# Patient Record
Sex: Female | Born: 1995 | Race: Black or African American | Hispanic: No | Marital: Single | Smoking: Current some day smoker
Health system: Southern US, Community
[De-identification: ages and names within clinical notes are randomized; demographics above are authoritative.]

## PROBLEM LIST (undated history)

## (undated) HISTORY — PX: CYST EXCISION: SHX5701

## (undated) HISTORY — PX: CHELATION OF EYE: SHX6708

---

## 2014-03-14 ENCOUNTER — Emergency Department (HOSPITAL_COMMUNITY): Payer: PRIVATE HEALTH INSURANCE

## 2014-03-14 ENCOUNTER — Encounter (HOSPITAL_COMMUNITY): Payer: Self-pay | Admitting: Emergency Medicine

## 2014-03-14 ENCOUNTER — Emergency Department (HOSPITAL_COMMUNITY)
Admission: EM | Admit: 2014-03-14 | Discharge: 2014-03-14 | Disposition: A | Discharge status: Home / Self Care | Payer: PRIVATE HEALTH INSURANCE | Attending: Emergency Medicine | Admitting: Emergency Medicine

## 2014-03-14 DIAGNOSIS — R509 Fever, unspecified: Secondary | ICD-10-CM | POA: Insufficient documentation

## 2014-03-14 DIAGNOSIS — Z3202 Encounter for pregnancy test, result negative: Secondary | ICD-10-CM | POA: Insufficient documentation

## 2014-03-14 DIAGNOSIS — R110 Nausea: Secondary | ICD-10-CM | POA: Insufficient documentation

## 2014-03-14 DIAGNOSIS — J029 Acute pharyngitis, unspecified: Secondary | ICD-10-CM | POA: Diagnosis present

## 2014-03-14 DIAGNOSIS — J039 Acute tonsillitis, unspecified: Secondary | ICD-10-CM | POA: Diagnosis not present

## 2014-03-14 DIAGNOSIS — IMO0001 Reserved for inherently not codable concepts without codable children: Secondary | ICD-10-CM | POA: Diagnosis not present

## 2014-03-14 DIAGNOSIS — J3489 Other specified disorders of nose and nasal sinuses: Secondary | ICD-10-CM | POA: Diagnosis not present

## 2014-03-14 DIAGNOSIS — R197 Diarrhea, unspecified: Secondary | ICD-10-CM | POA: Diagnosis not present

## 2014-03-14 LAB — COMPREHENSIVE METABOLIC PANEL
ALT: 14 U/L (ref 0–35)
AST: 15 U/L (ref 0–37)
Albumin: 3.6 g/dL (ref 3.5–5.2)
Alkaline Phosphatase: 81 U/L (ref 39–117)
Anion gap: 12 (ref 5–15)
BILIRUBIN TOTAL: 0.3 mg/dL (ref 0.3–1.2)
BUN: 5 mg/dL — ABNORMAL LOW (ref 6–23)
CALCIUM: 9 mg/dL (ref 8.4–10.5)
CO2: 23 meq/L (ref 19–32)
CREATININE: 0.48 mg/dL — AB (ref 0.50–1.10)
Chloride: 106 mEq/L (ref 96–112)
GFR calc Af Amer: >90 mL/min (ref >90)
GLUCOSE: 88 mg/dL (ref 70–99)
Potassium: 3.7 mEq/L (ref 3.7–5.3)
Sodium: 141 mEq/L (ref 137–147)
Total Protein: 7.3 g/dL (ref 6.0–8.3)

## 2014-03-14 LAB — URINALYSIS, ROUTINE W REFLEX MICROSCOPIC
BILIRUBIN URINE: NEGATIVE
Glucose, UA: NEGATIVE mg/dL
HGB URINE DIPSTICK: NEGATIVE
KETONES UR: NEGATIVE mg/dL
Nitrite: NEGATIVE
PH: 7 (ref 5.0–8.0)
Protein, ur: NEGATIVE mg/dL
SPECIFIC GRAVITY, URINE: 1.015 (ref 1.005–1.030)
Urobilinogen, UA: 1 mg/dL (ref 0.0–1.0)

## 2014-03-14 LAB — CBC WITH DIFFERENTIAL/PLATELET
Basophils Absolute: 0 10*3/uL (ref 0.0–0.1)
Basophils Relative: 0 % (ref 0–1)
EOS PCT: 1 % (ref 0–5)
Eosinophils Absolute: 0.1 10*3/uL (ref 0.0–0.7)
HEMATOCRIT: 37.3 % (ref 36.0–46.0)
Hemoglobin: 12.2 g/dL (ref 12.0–15.0)
LYMPHS ABS: 2 10*3/uL (ref 0.7–4.0)
Lymphocytes Relative: 21 % (ref 12–46)
MCH: 29.3 pg (ref 26.0–34.0)
MCHC: 32.7 g/dL (ref 30.0–36.0)
MCV: 89.7 fL (ref 78.0–100.0)
MONO ABS: 1 10*3/uL (ref 0.1–1.0)
Monocytes Relative: 10 % (ref 3–12)
Neutro Abs: 6.6 10*3/uL (ref 1.7–7.7)
Neutrophils Relative %: 68 % (ref 43–77)
Platelets: 270 10*3/uL (ref 150–400)
RBC: 4.16 MIL/uL (ref 3.87–5.11)
RDW: 12.7 % (ref 11.5–15.5)
WBC: 9.7 10*3/uL (ref 4.0–10.5)

## 2014-03-14 LAB — RAPID STREP SCREEN (MED CTR MEBANE ONLY): Streptococcus, Group A Screen (Direct): NEGATIVE

## 2014-03-14 LAB — URINE MICROSCOPIC-ADD ON

## 2014-03-14 LAB — LIPASE, BLOOD: LIPASE: 17 U/L (ref 11–59)

## 2014-03-14 LAB — POC URINE PREG, ED: Preg Test, Ur: NEGATIVE

## 2014-03-14 MED ORDER — CLINDAMYCIN HCL 300 MG PO CAPS: 300.0000 mg | ORAL_CAPSULE | Freq: Three times a day (TID) | ORAL | Status: DC

## 2014-03-14 MED ORDER — SODIUM CHLORIDE 0.9 % IV BOLUS (SEPSIS)
1000.0000 mL | Freq: Once | INTRAVENOUS | Status: AC
  Administered 2014-03-14: 1000 mL via INTRAVENOUS

## 2014-03-14 MED ORDER — DEXAMETHASONE SODIUM PHOSPHATE 10 MG/ML IJ SOLN
10.0000 mg | Freq: Once | INTRAMUSCULAR | Status: AC
  Administered 2014-03-14: 10 mg via INTRAMUSCULAR
  Filled 2014-03-14: qty 1

## 2014-03-14 MED ORDER — IOHEXOL 300 MG/ML  SOLN
75.0000 mL | Freq: Once | INTRAMUSCULAR | Status: AC | PRN
  Administered 2014-03-14: 75 mL via INTRAVENOUS

## 2014-03-14 NOTE — ED Provider Notes (Signed)
Medical screening examination/treatment/procedure(s) were performed by non-physician practitioner and as supervising physician I was immediately available for consultation/collaboration.   EKG Interpretation None       Marlon Suleiman, MD 03/14/14 1740 

## 2014-03-14 NOTE — Discharge Instructions (Signed)
Please call your doctor for a followup appointment within 24-48 hours. When you talk to your doctor please let them know that you were seen in the emergency department and have them acquire all of your records so that they can discuss the findings with you and formulate a treatment plan to fully care for your new and ongoing problems. Please take antibiotics as prescribed on a full stomach  Please rest and stay hydrated Please avoid sharing drinks or food Please continue to monitor symptoms closely and if symptoms are to worsen or change (fever greater than 101, chills, sweating, nausea, vomiting, chest pain, shortness of breathe, difficulty breathing, weakness, numbness, tingling, worsening or changes to pain pattern, neck pain, neck stiffness, throat closing sensation, difficulty swallowing, coughing up of blood) please report back to the Emergency Department immediately.    Pharyngitis Pharyngitis is a sore throat (pharynx). There is redness, pain, and swelling of your throat. HOME CARE   Drink enough fluids to keep your pee (urine) clear or pale yellow.  Only take medicine as told by your doctor.  You may get sick again if you do not take medicine as told. Finish your medicines, even if you start to feel better.  Do not take aspirin.  Rest.  Rinse your mouth (gargle) with salt water ( tsp of salt per 1 qt of water) every 1-2 hours. This will help the pain.  If you are not at risk for choking, you can suck on hard candy or sore throat lozenges. GET HELP IF:  You have large, tender lumps on your neck.  You have a rash.  You cough up green, yellow-brown, or bloody spit. GET HELP RIGHT AWAY IF:   You have a stiff neck.  You drool or cannot swallow liquids.  You throw up (vomit) or are not able to keep medicine or liquids down.  You have very bad pain that does not go away with medicine.  You have problems breathing (not from a stuffy nose). MAKE SURE YOU:   Understand  these instructions.  Will watch your condition.  Will get help right away if you are not doing well or get worse. Document Released: 12/21/2007 Document Revised: 04/24/2013 Document Reviewed: 03/11/2013 [facility] Patient Information 2015 [place], [place]. This information is not intended to replace advice given to you by your health care provider. Make sure you discuss any questions you have with your health care provider. Tonsillitis Tonsillitis is an infection of the throat that causes the tonsils to become red, tender, and swollen. Tonsils are collections of lymphoid tissue at the back of the throat. Each tonsil has crevices (crypts). Tonsils help fight nose and throat infections and keep infection from spreading to other parts of the body for the first 18 months of life.  CAUSES Sudden (acute) tonsillitis is usually caused by infection with streptococcal bacteria. Long-lasting (chronic) tonsillitis occurs when the crypts of the tonsils become filled with pieces of food and bacteria, which makes it easy for the tonsils to become repeatedly infected. SYMPTOMS  Symptoms of tonsillitis include:  A sore throat, with possible difficulty swallowing.  White patches on the tonsils.  Fever.  Tiredness.  New episodes of snoring during sleep, when you did not snore before.  Small, foul-smelling, yellowish-white pieces of material (tonsilloliths) that you occasionally cough up or spit out. The tonsilloliths can also cause you to have bad breath. DIAGNOSIS Tonsillitis can be diagnosed through a physical exam. Diagnosis can be confirmed with the results of lab tests, including  a throat culture. TREATMENT  The goals of tonsillitis treatment include the reduction of the severity and duration of symptoms and prevention of associated conditions. Symptoms of tonsillitis can be improved with the use of steroids to reduce the swelling. Tonsillitis caused by bacteria can be treated with antibiotic medicines.  Usually, treatment with antibiotic medicines is started before the cause of the tonsillitis is known. However, if it is determined that the cause is not bacterial, antibiotic medicines will not treat the tonsillitis. If attacks of tonsillitis are severe and frequent, your health care provider may recommend surgery to remove the tonsils (tonsillectomy). HOME CARE INSTRUCTIONS   Rest as much as possible and get plenty of sleep.  Drink plenty of fluids. While the throat is very sore, eat soft foods or liquids, such as sherbet, soups, or instant breakfast drinks.  Eat frozen ice pops.  Gargle with a warm or cold liquid to help soothe the throat. Mix 1/4 teaspoon of salt and 1/4 teaspoon of baking soda in 8 oz of water. SEEK MEDICAL CARE IF:   Large, tender lumps develop in your neck.  A rash develops.  A green, yellow-brown, or bloody substance is coughed up.  You are unable to swallow liquids or food for 24 hours.  You notice that only one of the tonsils is swollen. SEEK IMMEDIATE MEDICAL CARE IF:   You develop any new symptoms such as vomiting, severe headache, stiff neck, chest pain, or trouble breathing or swallowing.  You have severe throat pain along with drooling or voice changes.  You have severe pain, unrelieved with recommended medications.  You are unable to fully open the mouth.  You develop redness, swelling, or severe pain anywhere in the neck.  You have a fever. MAKE SURE YOU:   Understand these instructions.  Will watch your condition.  Will get help right away if you are not doing well or get worse. Document Released: 04/13/2005 Document Revised: 11/18/2013 Document Reviewed: 12/21/2012 [facility] Patient Information 2015 [place], [place]. This information is not intended to replace advice given to you by your health care provider. Make sure you discuss any questions you have with your health care provider.   Emergency Department Resource Guide 1) Find a Doctor  and Pay Out of Pocket Although you won't have to find out who is covered by your insurance plan, it is a good idea to ask around and get recommendations. You will then need to call the office and see if the doctor you have chosen will accept you as a new patient and what types of options they offer for patients who are self-pay. Some doctors offer discounts or will set up payment plans for their patients who do not have insurance, but you will need to ask so you aren't surprised when you get to your appointment.  2) Contact Your Local Health Department Not all health departments have doctors that can see patients for sick visits, but many do, so it is worth a call to see if yours does. If you don't know where your local health department is, you can check in your phone book. The CDC also has a tool to help you locate your state's health department, and many state websites also have listings of all of their local health departments.  3) Find a Walk-in Clinic If your illness is not likely to be very severe or complicated, you may want to try a walk in clinic. These are popping up all over the country in pharmacies, drugstores, and  shopping centers. They're usually staffed by nurse practitioners or physician assistants that have been trained to treat common illnesses and complaints. They're usually fairly quick and inexpensive. However, if you have serious medical issues or chronic medical problems, these are probably not your best option.  No Primary Care Doctor: - Call Health Connect at  [phone] - they can help you locate a primary care doctor that  accepts your insurance, provides certain services, etc. - Physician Referral Service- [phone]  Chronic Pain Problems: Organization         Address  Phone   Notes  [facility]  [phone] Patients need to be referred by their primary care doctor.   Medication Assistance: Organization         Address  Phone    Notes  [facility] Medication [facility] [address] [place], [place] [address] [phone] --Must be a resident of [facility] -- Must have NO insurance coverage whatsoever (no Medicaid/ Medicare, etc.) -- The pt. MUST have a primary care doctor that directs their care regularly and follows them in the community   MedAssist  [phone]   [employer]  [phone]    Agencies that provide inexpensive medical care: Organization         Address  Phone   Notes  [place]  [phone]   [facility]    [phone]   [facility] [place], [place] [address] [phone]   [place] [address] [place] [phone]   Planned Parenthood    [phone]   [facility]    [phone]   [place] and [place] [address], [place] Phone:  [phone], Fax:  [phone] Hours of Operation:  9 am - 6 pm, M-F.  Also accepts Medicaid/Medicare and self-pay.  [facility] for [place], [address], [place] Phone: [phone], Fax: [phone]. Hours of Operation:  8:30 am - 5:30 pm, M-F.  Also accepts Medicaid and self-pay.  [facility] [address], [place] Phone: [phone]   [place], [place], [place] [phone], Ext. [phone] Mondays & Thursdays: 7-9 AM.  First 15 patients are seen on a first come, first serve basis.    [place] Providers:  Organization         Address  Phone   Notes  [facility] [address], [place] [phone] Also accepts self-pay patients.  [facility] [address], [place]  [phone]   [place], [address], [place] [phone]   [facility] [address], [place] [phone]   Lucianne Lei [address], [place]   [phone] Only accepts [place] Access [place] patients after they have their name applied to their card.   Self-Pay (no insurance) in [facility]:  Organization         Address  Phone   Notes  Sickle Cell Patients, [facility] Internal Medicine [place] [phone]   [facility] [place] [phone]   [facility]gent [place]  [place] [address] [place], [place] [phone]  [facility]/Dr. Osei-Bonsu  [address] [place] or [address], [place] [phone] Phone number for both [place] and [place] locations is the same.  Urgent Medical and [facility] [place] [address], [place] [phone]   [facility] [address], [place] or [address] [phone]   [facility] [address], [place] [phone], phone; [phone], fax Sees patients 1st and 3rd Saturday of every month.  Must not qualify for public or private insurance (i.e. Medicaid, Medicare, [place] Health Choice, Veterans' Benefits)  Household income should be no more than 200% of the poverty level The clinic cannot treat you if you are pregnant or think you are pregnant  Sexually transmitted diseases are not treated at the clinic.    Dental Care: Organization         Address  Phone  Notes  [facility] of [facility] [phone] Accepts children up to age 74 who are enrolled in [place] or [place]; pregnant women with a Medicaid card; and children who have applied for Medicaid or [place] Health Choice, but were declined, whose parents can pay a reduced fee at time of service.  [facility] Department of [facility]  [address], [place] [phone] Accepts children up to age 60 who are enrolled in [place] or [place]; pregnant women with a Medicaid card; and children who have applied for Medicaid or [place] Health Choice, but were declined, whose parents can pay a reduced fee at time of service.  [place] Adult Dental Access PROGRAM  [place] [phone] Patients are seen by appointment only. Walk-ins are not accepted. [place] will see patients 4 years of age and older. Monday - Tuesday (8am-5pm) Most Wednesdays (8:30-5pm) $30 per visit, cash only  [facility] Adult Dental Access PROGRAM  [address], [facility] [phone] Patients are seen by appointment only. Walk-ins are not accepted. [place] will see patients 46 years of age and older. One Wednesday Evening (Monthly: Volunteer Based).  $30 per visit, cash only  [place]  [phone] for adults; Children under age 27, call Graduate Pediatric Dentistry at [phone]. Children aged 67-14, please call [phone] to request a pediatric application.  Dental services are provided in all areas of dental care including fillings, crowns and bridges, complete and partial dentures, implants, gum treatment, root canals, and extractions. Preventive care is also provided. Treatment is provided to both adults and children. Patients are selected via a lottery and there is often a waiting list.   [facility] [address] [place]  [phone] [email or web address]   [facility] [address] [place], [place] [phone], Ext. [phone] Second and Fourth Thursday of each month, opens at 6:30 AM; Clinic ends at 9 AM.  Patients are seen on a first-come first-served basis, and a limited number are seen during each clinic.   [facility]  [address] [place], [place] [phone]   Eligibility Requirements You must have lived in  [place], [place], or [place] counties for at least the last three months.   You cannot be eligible for state or federal sponsored [employer], including [employer], [place], or [employer].   You generally cannot be eligible for healthcare insurance  through your employer.    How to apply: Eligibility screenings are held every Tuesday and Wednesday afternoon from 1:00 pm until 4:00 pm. You do not need an appointment for the interview!  [facility] [address], [place], [place]   [place]  [place] Department  [place]  [phone]    Behavioral Health Resources in the Community: Intensive Outpatient Programs Organization         Address  Phone  Notes  [place]. [address] [place], [place] [phone]   [facility]patient [address], [place], [place]   ADS: Alcohol & Drug Svcs [address] [place], [place]   [place] [address]  [place], [place] or [phone]   Substance Abuse Resources Organization         Address  Phone  Notes  Alcohol and Drug Services  [phone]   [place]  [phone]   [facility] [place]   Chinita Pester  [phone]   Residential & Outpatient Substance Abuse Program  [phone]   Psychological Services Organization         Address  Phone  Notes  [facility] [place]  [place]  [phone]   [place] [address] [place] or [phone]    Mobile Crisis Teams Organization         Address  Phone  Notes  [facility], Mobile Crisis Care Unit  [phone]   [facility] Psychotherapeutic Services  [address] [place], [place]   [place] [address] [place] [phone]    Self-Help/Support Groups Organization         Address  Phone             Notes  [place]. of [place] - variety of support groups  [place] Call for more information  Narcotics Anonymous (NA), [facility] [address], [employer] [place]  2 meetings at this location   Special educational needs teacher         Address  Phone  Notes  ASAP Residential Treatment [place],    [place]  [phone]   [facility]  [address], [place] 292446, [place], [place]   [place], [place] [phone] Admissions: 8am-3pm M-F  Incentives Substance [place] [address]    [place], [place] [phone]   [facility] [address] [place], [place], [place]   [facility] [address]  [place], [place]   [facility] - Intensive Outpatient [address] Kristeen [address], [place], [place]   [facility] ([place].,  [place], [place] [phone] or [phone]   [facility] Services (RTS) [address], [place], [place] Accepts Medicaid  [facility] [place] [address],  [place] [phone])[phone] Substance Abuse/Addiction Treatment   [facility] Organization         Address  Phone  Notes  [facility]  [phone]   Domenic Schwab, PhD [address] [place], [place]   [phone] or [phone]   [place], [place] [phone]  [facility] [address] [place], [place] [phone] Insurance/Medicaid/sponsorship through [employer] and Families [address]                                    [place], [place] [phone] Therapy/tele-psych/case  [place] [address]   [place], [place] [phone]    Dr. Lolly Mustache  [phone]   [facility] [place]  [employer] [facility] Dept. 1) [address] [place] 2) [address], [place] 3)  [address], [place] [phone]  [phone]   [facility] Child Abuse Hotline (832)557-2175 or [phone] (After Hours)

## 2014-03-14 NOTE — ED Notes (Signed)
Pt arrives with c/o sore throat, fever, generalized body aches since Monday. Tylenol and Advil effective. Pt comes in tonight d/t onset nausea and nose bleed.

## 2014-03-14 NOTE — ED Provider Notes (Signed)
CSN: [id number]     Arrival date & time 03/14/14  4098 History   First MD Initiated Contact with Patient 03/14/14 [phone]     Chief Complaint  Patient presents with  . Sore Throat  . Fever     (Consider location/radiation/quality/duration/timing/severity/associated sxs/prior Treatment) The history is provided by the patient. No language interpreter was used.  Sheryl Brady is an 18 y/o F with no known significant PMHx presenting to the ED with sore throat that started on Monday. Patient reported that the sore throat has been constant, worse with swallowing food - described as a scratchy, soreness. Stated that on Monday she noticed that she started to develop chills, stated that she took her temperature which was 100.28F - stated that she has been alternating Tylenol and Ibuprofen - reported that she has not had a fever since Wednesday morning. Patient stated that on Tuesday she started to feel nauseous, yet denied any episodes of emesis. Stated that on Tuesday she started to have diarrhea described as a loose watery stool without mucus and blood - stated that she has been having diarrhea with each BM this week. Reported that she's been feeling rather gaseous with continuous burping and passing flatulence. Associated symptoms are nasal congestion, dry cough. Stated that she had an episode of epistaxis that lasted approximately 5 minutes. Denied difficulty swallowing, neck pain, blurred vision, sudden loss of vision, hemoptysis, melena, hematochezia, vomiting, abdominal pain, dysuria, hematuria, throat closing sensation, chest pain, shortness of breath, difficulty breathing. Denies sick contacts. Last menstrual period was August 2014-patient is on Depo shots, last shot July of 2015. PCP in [place]  History reviewed. No pertinent past medical history. Past Surgical History  Procedure Laterality Date  . Cyst excision      Left leg   No family history on file. History  Substance Use Topics  . Smoking  status: Never Smoker   . Smokeless tobacco: Not on file  . Alcohol Use: Yes   OB History   Grav Para Term Preterm Abortions TAB SAB Ect Mult Living                 Review of Systems  Constitutional: Positive for fever and chills.  HENT: Positive for congestion and sore throat. Negative for ear pain, sneezing and trouble swallowing.   Eyes: Negative for pain and visual disturbance.  Respiratory: Negative for chest tightness and shortness of breath.   Cardiovascular: Negative for chest pain.  Gastrointestinal: Positive for nausea and diarrhea. Negative for vomiting, abdominal pain, constipation, blood in stool and anal bleeding.  Genitourinary: Negative for dysuria and hematuria.  Musculoskeletal: Positive for myalgias (generalized). Negative for back pain and neck pain.  Neurological: Negative for dizziness, weakness and headaches.      Allergies  Review of patient's allergies indicates no known allergies.  Home Medications   Prior to Admission medications   Medication Sig Start Date End Date Taking? Authorizing Provider  acetaminophen (TYLENOL) 325 MG tablet Take 650 mg by mouth every 6 (six) hours as needed for fever.   Yes Historical Provider, MD  ibuprofen (ADVIL,MOTRIN) 200 MG tablet Take 200 mg by mouth every 6 (six) hours as needed for fever.   Yes Historical Provider, MD  clindamycin (CLEOCIN) 300 MG capsule Take 1 capsule (300 mg total) by mouth 3 (three) times daily. X 7 days 03/14/14   Jaxton Casale, PA-C   BP 116/65  Pulse 90  Temp(Src) 99.1 F (37.3 C) (Oral)  Resp 18  Ht 5'  3" (1.6 m)  Wt 160 lb (72.576 kg)  BMI 28.35 kg/m2  SpO2 100%  LMP 02/15/2013 Physical Exam  Nursing note and vitals reviewed. Constitutional: She is oriented to person, place, and time. She appears well-developed and well-nourished. No distress.  Patient sitting comfortably upright in bed without any signs of acute distress.  HENT:  Head: Normocephalic and atraumatic.  Right Ear:  Hearing, tympanic membrane, external ear and ear canal normal.  Left Ear: Hearing, tympanic membrane, external ear and ear canal normal.  Nose: Nose normal. No mucosal edema, rhinorrhea, nose lacerations, sinus tenderness, nasal deformity, septal deviation or nasal septal hematoma.  No foreign bodies.  Mouth/Throat: Uvula is midline and mucous membranes are normal. No trismus in the jaw. Uvula swelling present. Oropharyngeal exudate present. No posterior oropharyngeal edema or posterior oropharyngeal erythema.  Bilateral tonsillar adenopathy identified-tonsils erythematous with mild exudate. Negative petechiae identified to the soft palate. Uvula midline-mild swelling identified. Negative trismus. Mild hot-potato voice.   Eyes: Conjunctivae and EOM are normal. Pupils are equal, round, and reactive to light. Right eye exhibits no discharge. Left eye exhibits no discharge.  Neck: Normal range of motion. Neck supple. No tracheal deviation present.  Negative neck stiffness Negative nuchal rigidity Negative meningeal signs  Bilateral tonsillar adenopathy-soft, mobile with mild discomfort upon palpation  Cardiovascular: Normal rate, regular rhythm and normal heart sounds.   Pulses:      Radial pulses are 2+ on the right side, and 2+ on the left side.       Dorsalis pedis pulses are 2+ on the right side, and 2+ on the left side.  Pulmonary/Chest: Effort normal and breath sounds normal. No respiratory distress. She has no wheezes. She has no rales. She exhibits no tenderness.  Patient is able to speak in full sentences without difficulty Negative use of accessory muscles Negative stridor  Abdominal: Soft. Bowel sounds are normal. She exhibits no distension. There is no tenderness. There is no rebound and no guarding.  Negative abdominal distention Bowel sounds normoactive in all 4 quadrants Abdomen soft upon palpation Negative rigidity or guarding Negative peritoneal signs  Musculoskeletal: Normal  range of motion. She exhibits no edema and no tenderness.  Full ROM to upper and lower extremities without difficulty noted, negative ataxia noted.  Lymphadenopathy:    She has cervical adenopathy.       Right cervical: Superficial cervical adenopathy present.       Left cervical: Superficial cervical adenopathy present.  Neurological: She is alert and oriented to person, place, and time. No cranial nerve deficit. She exhibits normal muscle tone. Coordination normal.  Cranial nerves III-XII grossly intact Negative facial droop Negative slurred speech Negative aphasia GCS 15 Patient follows commands well Patient answers questions appropriately  Skin: Skin is warm and dry. No rash noted. She is not diaphoretic. No erythema.  Psychiatric: She has a normal mood and affect. Her behavior is normal. Thought content normal.    ED Course  Procedures (including critical care time)  Results for orders placed during the hospital encounter of 03/14/14  RAPID STREP SCREEN      Result Value Ref Range   Streptococcus, Group A Screen (Direct) NEGATIVE  NEGATIVE  CBC WITH DIFFERENTIAL      Result Value Ref Range   WBC 9.7  4.0 - 10.5 K/uL   RBC 4.16  3.87 - 5.11 MIL/uL   Hemoglobin 12.2  12.0 - 15.0 g/dL   HCT 40.9  81.1 - 91.4 %   MCV 89.7  78.0 - 100.0 fL   MCH 29.3  26.0 - 34.0 pg   MCHC 32.7  30.0 - 36.0 g/dL   RDW 16.1  09.6 - 04.5 %   Platelets 270  150 - 400 K/uL   Neutrophils Relative % 68  43 - 77 %   Neutro Abs 6.6  1.7 - 7.7 K/uL   Lymphocytes Relative 21  12 - 46 %   Lymphs Abs 2.0  0.7 - 4.0 K/uL   Monocytes Relative 10  3 - 12 %   Monocytes Absolute 1.0  0.1 - 1.0 K/uL   Eosinophils Relative 1  0 - 5 %   Eosinophils Absolute 0.1  0.0 - 0.7 K/uL   Basophils Relative 0  0 - 1 %   Basophils Absolute 0.0  0.0 - 0.1 K/uL  COMPREHENSIVE METABOLIC PANEL      Result Value Ref Range   Sodium 141  137 - 147 mEq/L   Potassium 3.7  3.7 - 5.3 mEq/L   Chloride 106  96 - 112 mEq/L    CO2 23  19 - 32 mEq/L   Glucose, Bld 88  70 - 99 mg/dL   BUN 5 (*) 6 - 23 mg/dL   Creatinine, Ser 4.09 (*) 0.50 - 1.10 mg/dL   Calcium 9.0  8.4 - 81.1 mg/dL   Total Protein 7.3  6.0 - 8.3 g/dL   Albumin 3.6  3.5 - 5.2 g/dL   AST 15  0 - 37 U/L   ALT 14  0 - 35 U/L   Alkaline Phosphatase 81  39 - 117 U/L   Total Bilirubin 0.3  0.3 - 1.2 mg/dL   GFR calc non Af Amer >90  >90 mL/min   GFR calc Af Amer >90  >90 mL/min   Anion gap 12  5 - 15  LIPASE, BLOOD      Result Value Ref Range   Lipase 17  11 - 59 U/L  URINALYSIS, ROUTINE W REFLEX MICROSCOPIC      Result Value Ref Range   Color, Urine YELLOW  YELLOW   APPearance CLEAR  CLEAR   Specific Gravity, Urine 1.015  1.005 - 1.030   pH 7.0  5.0 - 8.0   Glucose, UA NEGATIVE  NEGATIVE mg/dL   Hgb urine dipstick NEGATIVE  NEGATIVE   Bilirubin Urine NEGATIVE  NEGATIVE   Ketones, ur NEGATIVE  NEGATIVE mg/dL   Protein, ur NEGATIVE  NEGATIVE mg/dL   Urobilinogen, UA 1.0  0.0 - 1.0 mg/dL   Nitrite NEGATIVE  NEGATIVE   Leukocytes, UA TRACE (*) NEGATIVE  URINE MICROSCOPIC-ADD ON      Result Value Ref Range   Squamous Epithelial / LPF FEW (*) RARE   WBC, UA 0-2  <3 WBC/hpf   Bacteria, UA FEW (*) RARE  POC URINE PREG, ED      Result Value Ref Range   Preg Test, Ur NEGATIVE  NEGATIVE    Labs Review Labs Reviewed  COMPREHENSIVE METABOLIC PANEL - Abnormal; Notable for the following:    BUN 5 (*)    Creatinine, Ser 0.48 (*)    All other components within normal limits  URINALYSIS, ROUTINE W REFLEX MICROSCOPIC - Abnormal; Notable for the following:    Leukocytes, UA TRACE (*)    All other components within normal limits  URINE MICROSCOPIC-ADD ON - Abnormal; Notable for the following:    Squamous Epithelial / LPF FEW (*)    Bacteria, UA FEW (*)  All other components within normal limits  RAPID STREP SCREEN  CULTURE, GROUP A STREP  CBC WITH DIFFERENTIAL  LIPASE, BLOOD  POC URINE PREG, ED    Imaging Review Dg Chest 2  View  03/14/2014   CLINICAL DATA:  Sore throat, fever  EXAM: CHEST  2 VIEW  COMPARISON:  None.  FINDINGS: The heart size and mediastinal contours are within normal limits. Both lungs are clear. The visualized skeletal structures are unremarkable.  IMPRESSION: No active cardiopulmonary disease.   Electronically Signed   By: Esperanza Heir M.D.   On: 03/14/2014 07:41   Ct Soft Tissue Neck W Contrast  03/14/2014   CLINICAL DATA:  Sore throat fever and generalized body aches for 5 days also nose bleeding and nausea  EXAM: CT NECK WITH CONTRAST  TECHNIQUE: Multidetector CT imaging of the neck was performed using the standard protocol following the bolus administration of intravenous contrast.  CONTRAST:  75mL OMNIPAQUE IOHEXOL 300 MG/ML  SOLN  COMPARISON:  None.  FINDINGS: The tonsils and adenoids are enlarged. There is no discrete abscess. The airway is not significantly compromised. There are enlarged anterior and posterior cervical lymph nodes bilaterally. The parotid and submandibular glands are normal. There is mucoperiosteal thickening within ethmoid sinuses bilaterally and within a right sphenoid sinus cell. There are no air-fluid levels. The jugular and carotid vessels are normal. The laryngeal structures and thyroid gland are unremarkable. The pulmonary apices are clear.  There is reversal of the normal cervical lordosis consistent with muscle spasm. The prevertebral soft tissue spaces are normal. The temporomandibular joints and mandible are normal. The orbital structures are unremarkable. The mastoid air cells were visualized are well pneumatized.  IMPRESSION: 1. Acute tonsillitis-pharyngitis without evidence of a peritonsillar abscess. There is no significant compromise of the airway. 2. There are mild inflammatory changes within the ethmoid sinus cells and a right sphenoid sinus cell.   Electronically Signed   By: David  [place]   On: 03/14/2014 08:09     EKG Interpretation None      MDM    Final diagnoses:  Tonsillitis    Medications  sodium chloride 0.9 % bolus 1,000 mL (1,000 mLs Intravenous New Bag/Given 03/14/14 0712)  iohexol (OMNIPAQUE) 300 MG/ML solution 75 mL (75 mLs Intravenous Contrast Given 03/14/14 0743)  dexamethasone (DECADRON) injection 10 mg (10 mg Intramuscular Given 03/14/14 0945)   Filed Vitals:   03/14/14 0800 03/14/14 0815 03/14/14 0830 03/14/14 0900  BP: 122/58 123/73 116/65   Pulse: 109 104 100 90  Temp:      TempSrc:      Resp: Height:      Weight:      SpO2: 99% 91% 100% 100%   CBC unremarkable. CMP identified low BUN of 5 and creatinine 0.48. Lipase negative elevation. Rapid strep test negative. Urine pregnancy negative. Urinalysis noted trace of leukocytes with negative pyuria.Chest x-ray unremarkable-negative for acute cardiopulmonary disease. CT soft tissue of neck with contrast noted acute tonsillitis and pharyngitis without evidence of peritonsillar abscess-in compromise of the airway. Mild inflammatory changes in ethmoid sinus cells and right sphenoid sinus cell.  Negative findings of peritonsillar abscess or retropharyngeal abscess. Patient presenting to the ED with tonsillitis-suspicion to be viral, cannot rule out bacterial. Patient stable, afebrile. Heart rate decreased while in ED setting from 103 beats per minutes 94 beats per minute. Patient tolerated fluids by mouth without difficulty. Negative episodes of emesis while in ED setting. Negative signs of respiratory distress.  Discharged patient. Discharge patient with antibiotics. Referred to ENT. Discussed with patient to rest and stay hydrated. Discussed with patient to closely monitor symptoms and if symptoms are to worsen or change to report back to the ED - strict return instructions given.  Patient agreed to plan of care, understood, all questions answered.   Raymon Mutton, PA-C 03/14/14 1656

## 2014-03-16 LAB — CULTURE, GROUP A STREP

## 2016-03-08 ENCOUNTER — Ambulatory Visit (HOSPITAL_COMMUNITY)
Admission: EM | Admit: 2016-03-08 | Discharge: 2016-03-08 | Discharge status: Absent without leave | Payer: BLUE CROSS/BLUE SHIELD

## 2016-03-26 IMAGING — CR DG CHEST 2V
2 series · 2 of 2 positions shown · non-contrast
Comparison: None.

CLINICAL DATA: Sore throat, fever

EXAM:
CHEST  2 VIEW

[w chest pa]
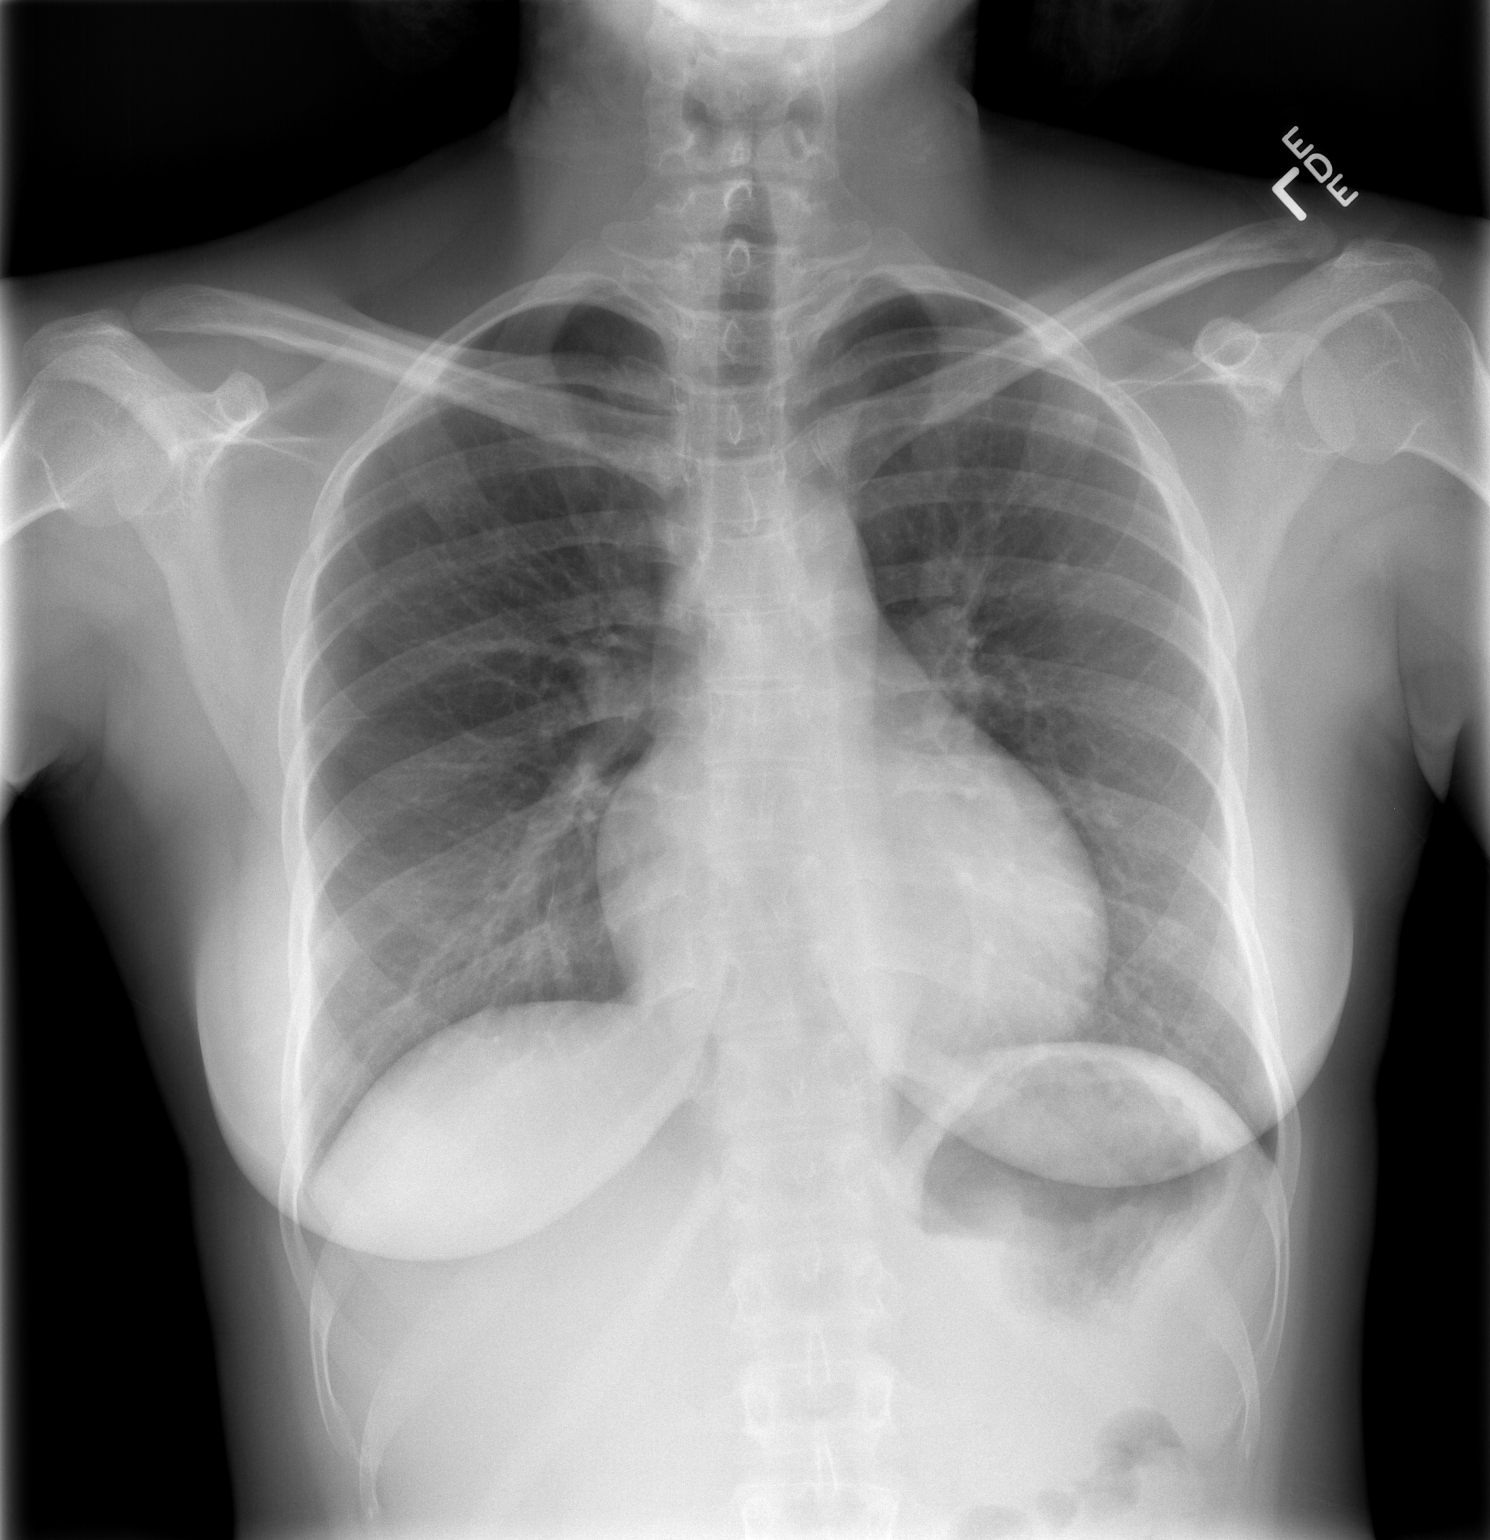

[w chest lat]
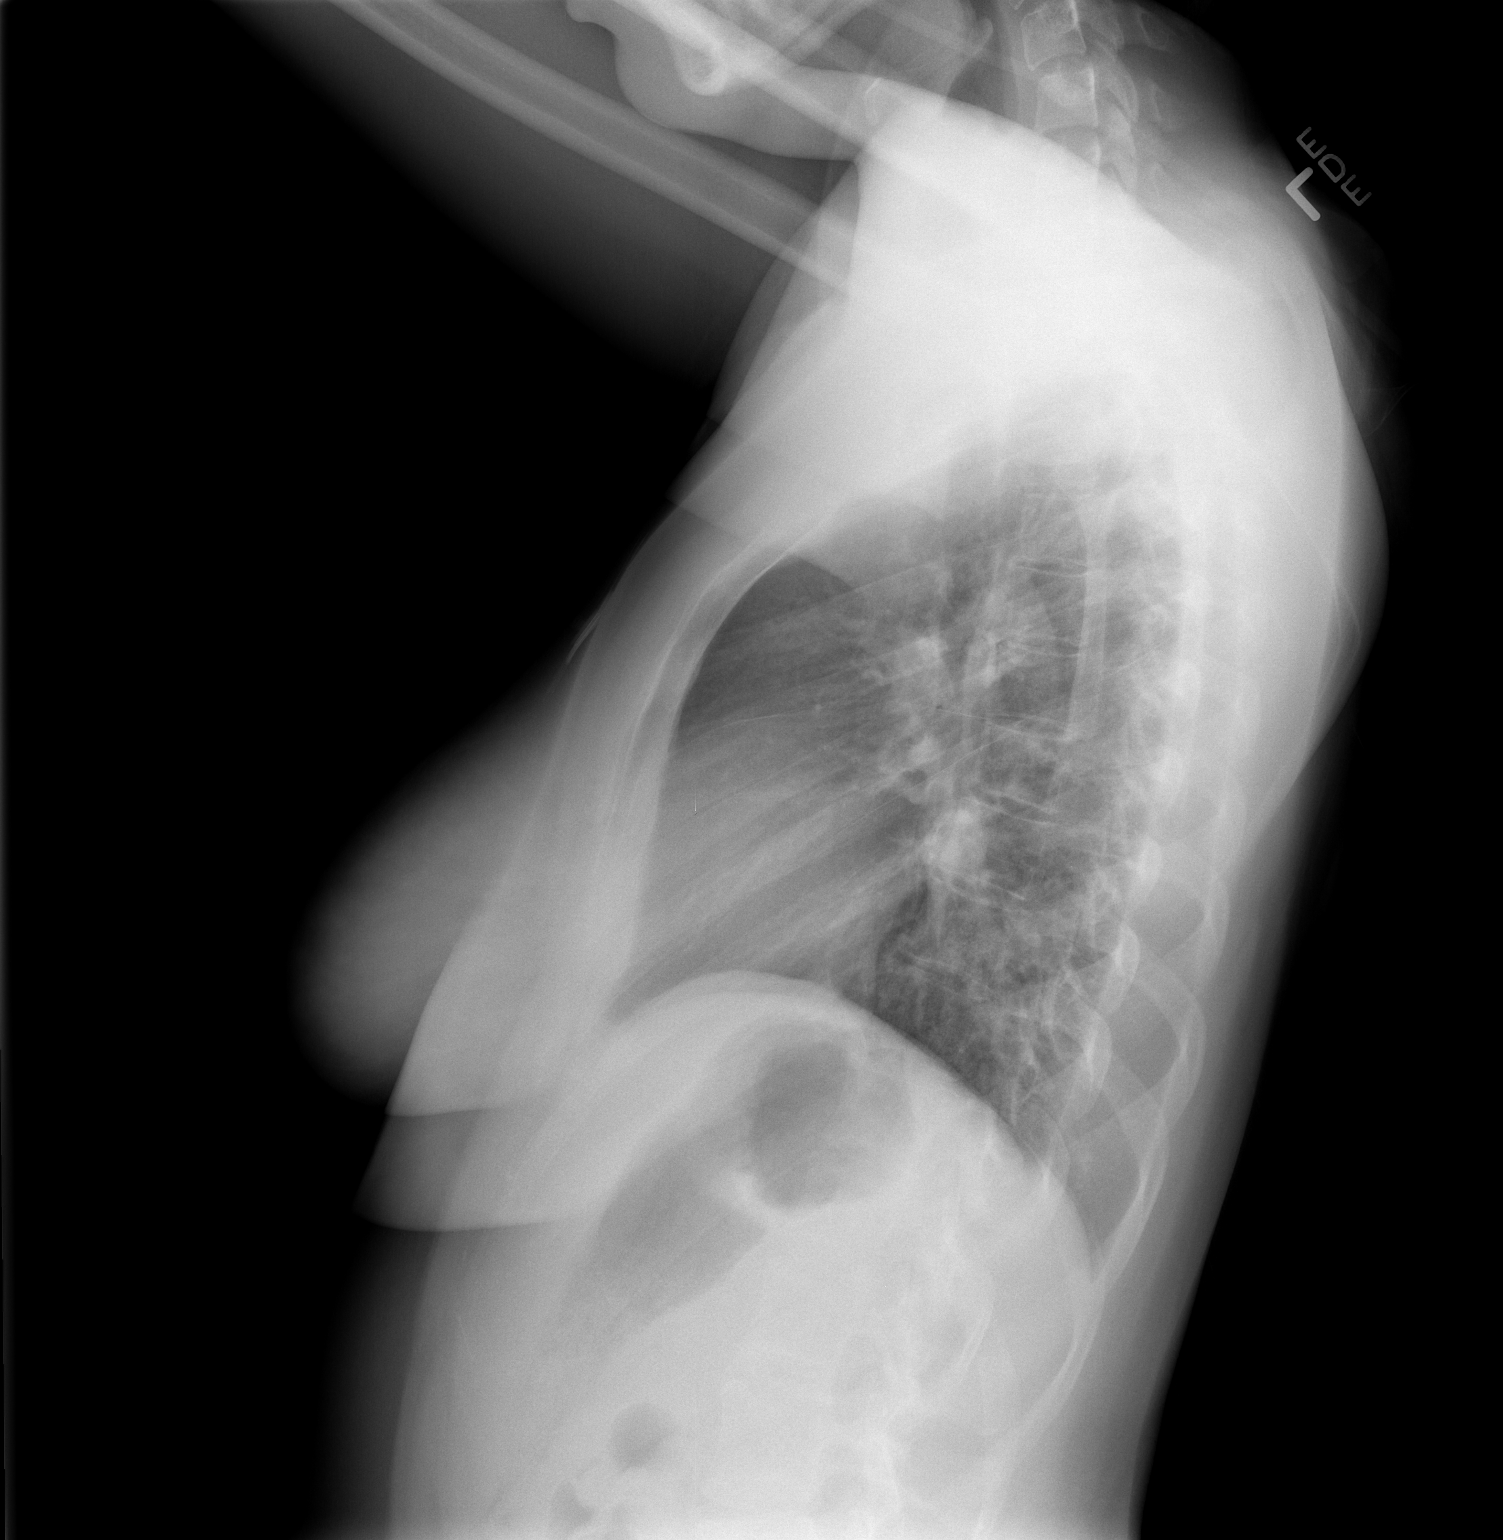

[2 of 2 positions shown; findings below may reference images not displayed]

FINDINGS: The heart size and mediastinal contours are within normal limits.
Both lungs are clear. The visualized skeletal structures are
unremarkable.
IMPRESSION: No active cardiopulmonary disease.

## 2016-03-26 IMAGING — CT CT NECK W/ CM
4 series · 15 of 33 positions shown, 18 images · IV contrast (Omni 300)
Comparison: None.

CLINICAL DATA: Sore throat fever and generalized body aches for 5
days also nose bleeding and nausea

EXAM:
CT NECK WITH CONTRAST
TECHNIQUE: Multidetector CT imaging of the neck was performed using the
standard protocol following the bolus administration of intravenous
contrast.
CONTRAST:  75mL OMNIPAQUE IOHEXOL 300 MG/ML  SOLN

[Series 2: neck 2.0 i31s 3 · axial · 0.47mm/px · z∈[-192,-72]mm · 5 of 91 slices shown, 7 images]
[im 16/91  soft-tissue]
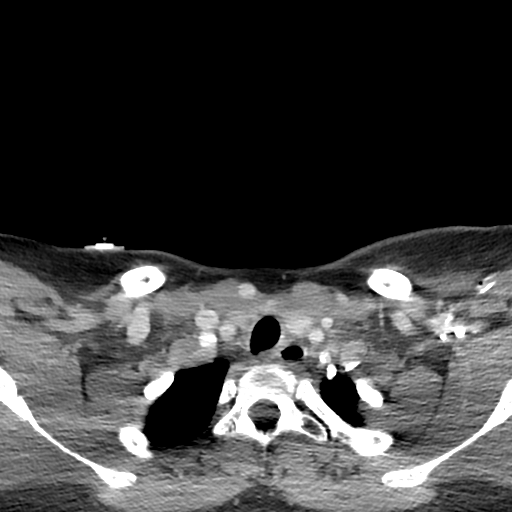
[im 16/91  bone]
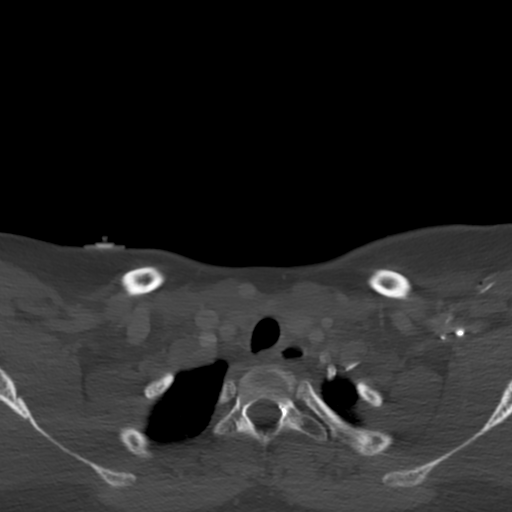
[im 31/91  bone]
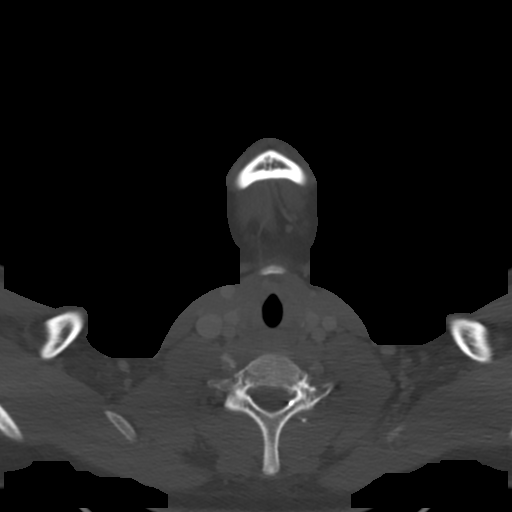
[im 46/91  bone]
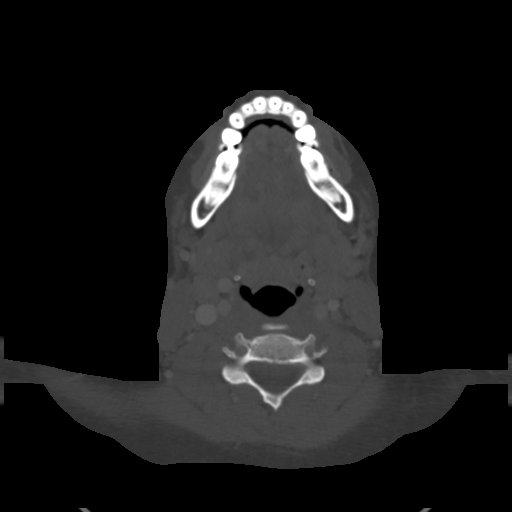
[im 61/91  bone]
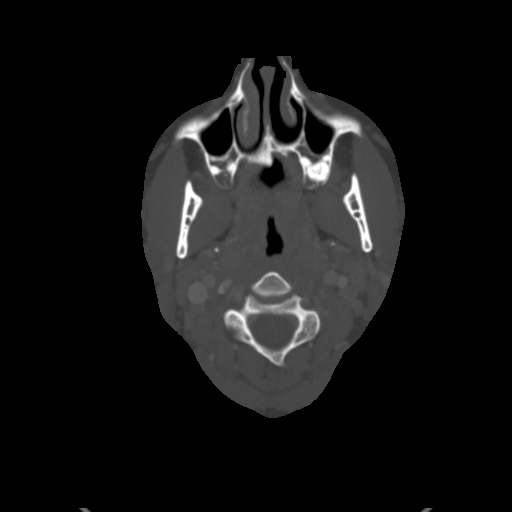
[im 76/91  soft-tissue]
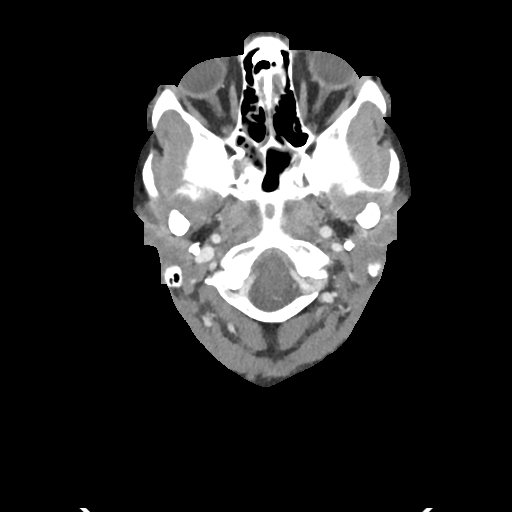
[im 76/91  bone]
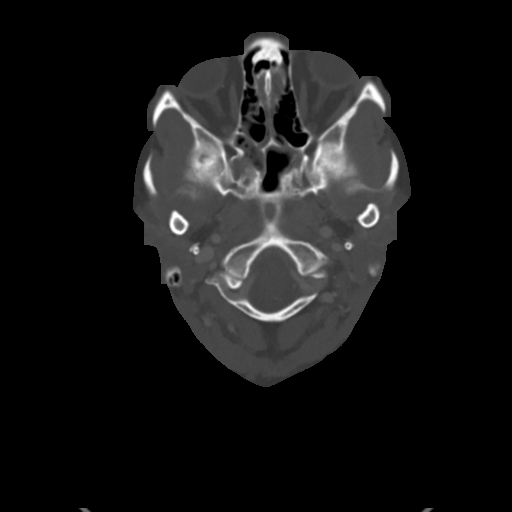

[Series 5: coronal st · coronal · 0.35mm/px · 3 of 112 slices shown]
[im 23/112  bone]
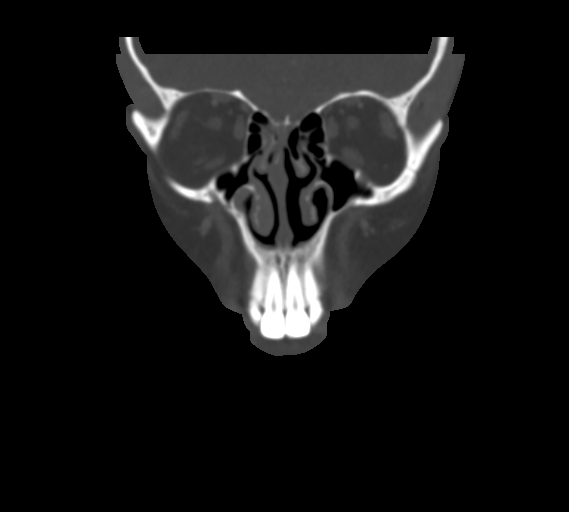
[im 45/112  bone]
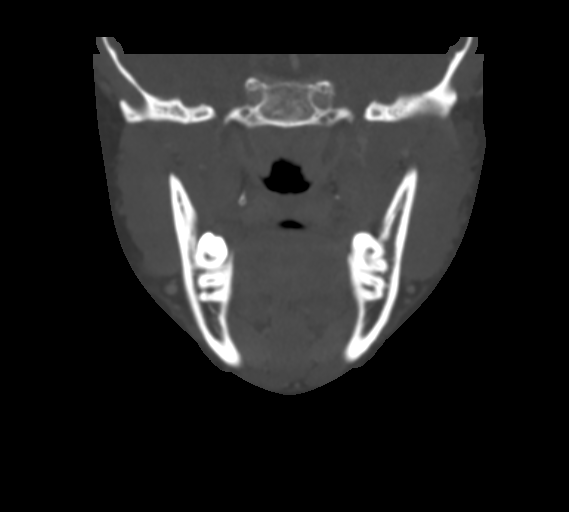
[im 67/112  bone]
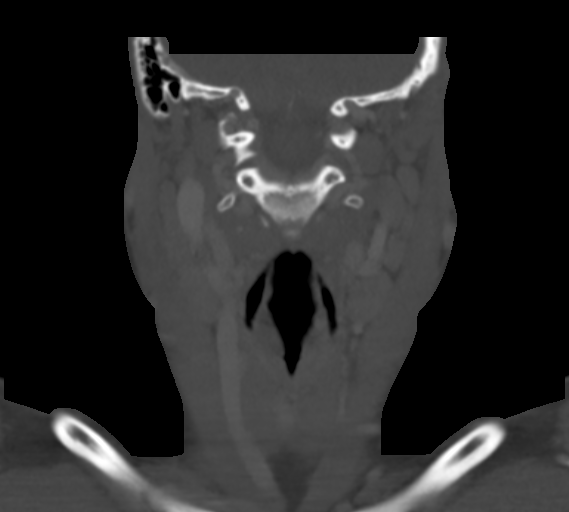

[Series 6: sagittal st · sagittal · 0.35mm/px · 5 of 108 slices shown, 6 images]
[im 36/108  bone]
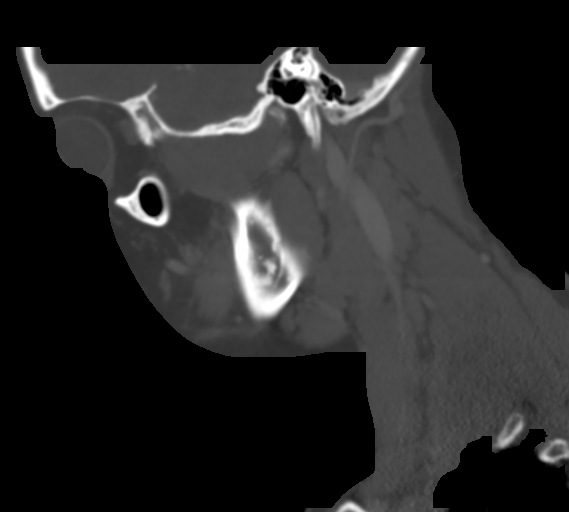
[im 45/108  bone]
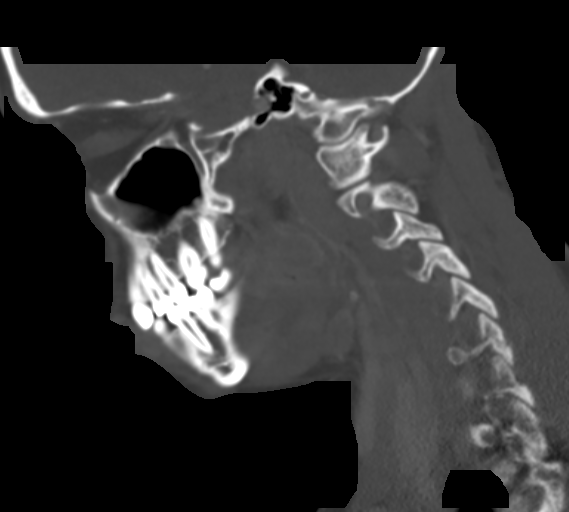
[im 54/108  soft-tissue]
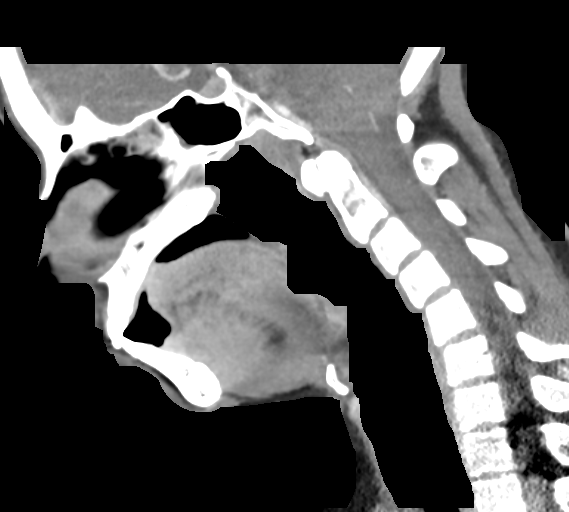
[im 54/108  bone]
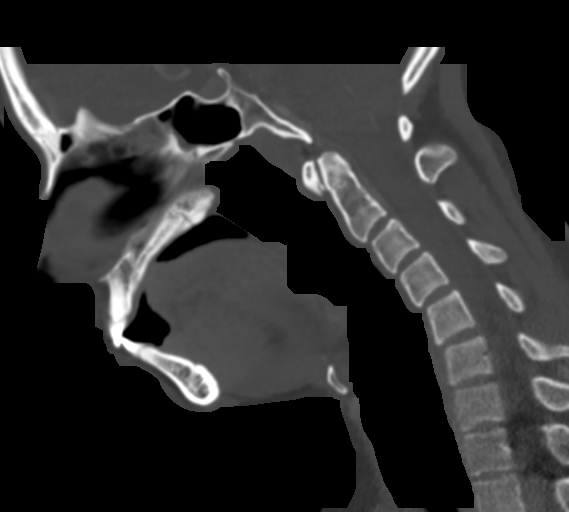
[im 63/108  bone]
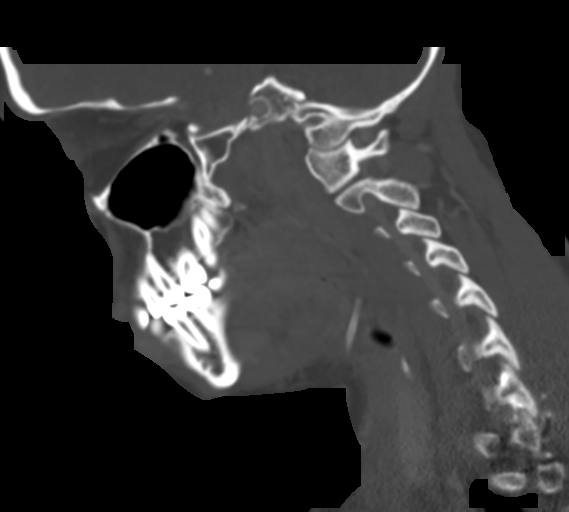
[im 72/108  bone]
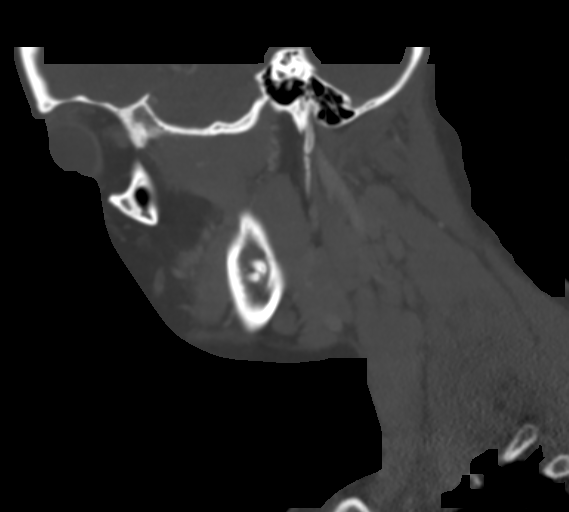

[Series 7: orthogonal st · axial · 0.39mm/px · z∈[-215,-186]mm · 2 of 92 slices shown]
[im 16/92  bone]
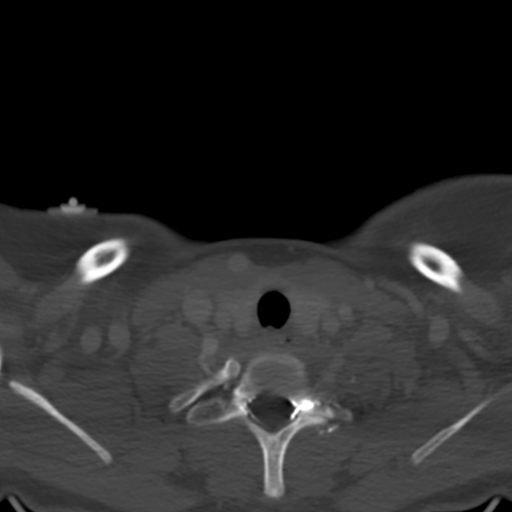
[im 31/92  bone]
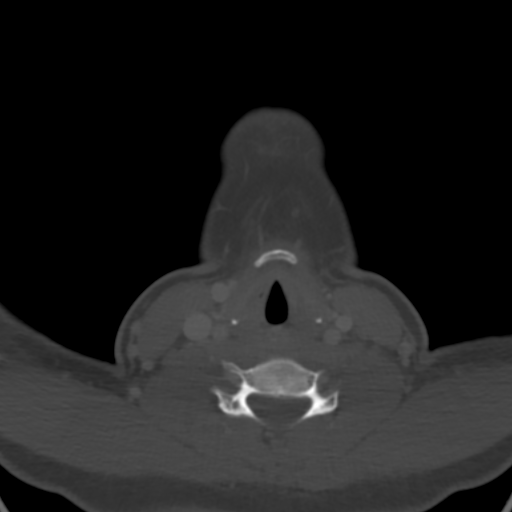

[15 of 33 positions shown; findings below may reference images not displayed]

FINDINGS: The tonsils and adenoids are enlarged. There is no discrete abscess.
The airway is not significantly compromised. There are enlarged
anterior and posterior cervical lymph nodes bilaterally. The parotid
and submandibular glands are normal. There is mucoperiosteal
thickening within ethmoid sinuses bilaterally and within a right
sphenoid sinus cell. There are no air-fluid levels. The jugular and
carotid vessels are normal. The laryngeal structures and thyroid
gland are unremarkable. The pulmonary apices are clear.

There is reversal of the normal cervical lordosis consistent with
muscle spasm. The prevertebral soft tissue spaces are normal. The
temporomandibular joints and mandible are normal. The orbital
structures are unremarkable. The mastoid air cells were visualized
are well pneumatized.
IMPRESSION: 1. Acute tonsillitis-pharyngitis without evidence of a peritonsillar
abscess. There is no significant compromise of the airway.
2. There are mild inflammatory changes within the ethmoid sinus
cells and a right sphenoid sinus cell.

## 2016-08-29 ENCOUNTER — Encounter: Payer: Self-pay | Admitting: Gastroenterology

## 2016-09-05 ENCOUNTER — Ambulatory Visit (INDEPENDENT_AMBULATORY_CARE_PROVIDER_SITE_OTHER): Payer: BLUE CROSS/BLUE SHIELD | Admitting: Gastroenterology

## 2016-09-05 ENCOUNTER — Encounter: Payer: Self-pay | Admitting: Gastroenterology

## 2016-09-05 ENCOUNTER — Other Ambulatory Visit (INDEPENDENT_AMBULATORY_CARE_PROVIDER_SITE_OTHER): Payer: BLUE CROSS/BLUE SHIELD

## 2016-09-05 ENCOUNTER — Encounter (INDEPENDENT_AMBULATORY_CARE_PROVIDER_SITE_OTHER): Payer: Self-pay

## 2016-09-05 VITALS — BP 110/70 | HR 92 | Ht 63.25 in | Wt 163.5 lb

## 2016-09-05 DIAGNOSIS — K625 Hemorrhage of anus and rectum: Secondary | ICD-10-CM

## 2016-09-05 DIAGNOSIS — R194 Change in bowel habit: Secondary | ICD-10-CM | POA: Diagnosis not present

## 2016-09-05 DIAGNOSIS — R109 Unspecified abdominal pain: Secondary | ICD-10-CM

## 2016-09-05 LAB — CBC WITH DIFFERENTIAL/PLATELET
BASOS ABS: 0 10*3/uL (ref 0.0–0.1)
Basophils Relative: 0.3 % (ref 0.0–3.0)
EOS ABS: 0 10*3/uL (ref 0.0–0.7)
Eosinophils Relative: 0.3 % (ref 0.0–5.0)
HEMATOCRIT: 38.9 % (ref 36.0–46.0)
Hemoglobin: 12.9 g/dL (ref 12.0–15.0)
LYMPHS ABS: 1.9 10*3/uL (ref 0.7–4.0)
LYMPHS PCT: 24.6 % (ref 12.0–46.0)
MCHC: 33.2 g/dL (ref 30.0–36.0)
MCV: 88.7 fl (ref 78.0–100.0)
Monocytes Absolute: 0.6 10*3/uL (ref 0.1–1.0)
Monocytes Relative: 7.4 % (ref 3.0–12.0)
NEUTROS ABS: 5.3 10*3/uL (ref 1.4–7.7)
NEUTROS PCT: 67.4 % (ref 43.0–77.0)
Platelets: 334 10*3/uL (ref 150.0–400.0)
RBC: 4.38 Mil/uL (ref 3.87–5.11)
RDW: 13.1 % (ref 11.5–15.5)
WBC: 7.9 10*3/uL (ref 4.0–10.5)

## 2016-09-05 LAB — COMPREHENSIVE METABOLIC PANEL
ALT: 10 U/L (ref 0–35)
AST: 16 U/L (ref 0–37)
Albumin: 4.3 g/dL (ref 3.5–5.2)
Alkaline Phosphatase: 61 U/L (ref 39–117)
BILIRUBIN TOTAL: 0.6 mg/dL (ref 0.2–1.2)
BUN: 7 mg/dL (ref 6–23)
CO2: 25 meq/L (ref 19–32)
CREATININE: 0.62 mg/dL (ref 0.40–1.20)
Calcium: 9.2 mg/dL (ref 8.4–10.5)
Chloride: 105 mEq/L (ref 96–112)
GFR: 155.97 mL/min (ref >60.00)
GLUCOSE: 99 mg/dL (ref 70–99)
Potassium: 3.7 mEq/L (ref 3.5–5.1)
Sodium: 138 mEq/L (ref 135–145)
Total Protein: 7.5 g/dL (ref 6.0–8.3)

## 2016-09-05 LAB — TSH: TSH: 0.55 u[IU]/mL (ref 0.35–4.50)

## 2016-09-05 LAB — HIGH SENSITIVITY CRP: CRP, High Sensitivity: 1.93 mg/L (ref 0.000–5.000)

## 2016-09-05 LAB — SEDIMENTATION RATE: Sed Rate: 15 mm/hr (ref 0–20)

## 2016-09-05 MED ORDER — NA SULFATE-K SULFATE-MG SULF 17.5-3.13-1.6 GM/177ML PO SOLN: 1.0000 | Freq: Once | ORAL | 0 refills | Status: AC

## 2016-09-05 NOTE — Patient Instructions (Signed)
If you are age 22 or older, your body mass index should be between 23-30. Your Body mass index is 28.73 kg/m. If this is out of the aforementioned range listed, please consider follow up with your Primary Care Provider.  If you are age 22 or younger, your body mass index should be between 19-25. Your Body mass index is 28.73 kg/m. If this is out of the aformentioned range listed, please consider follow up with your Primary Care Provider.   You have been scheduled for a colonoscopy. Please follow written instructions given to you at your visit today.  Please pick up your prep supplies at the pharmacy within the next 1-3 days. If you use inhalers (even only as needed), please bring them with you on the day of your procedure. Your physician has requested that you go to www.startemmi.com and enter the access code given to you at your visit today. This web site gives a general overview about your procedure. However, you should still follow specific instructions given to you by our office regarding your preparation for the procedure.  Thank you for choosing [facility]

## 2016-09-05 NOTE — Progress Notes (Signed)
09/05/2016 Sheryl Brady [id number] 1995-07-20  CC:  Rectal bleeding, abdominal pain, change in bowel habits  HISTORY OF PRESENT ILLNESS:  This is a 21 year old female who is new to our office.  She presents here today with complaints of change in bowel habits as well as abdominal and pain rectal bleeding.  She says that starting earlier this school year (so in the fall) she started having issues with constipation.  Previously she had always moved her bowels without any issues.  She says that she's also had two episodes of severe diarrhea with incontinence but when those resolve she goes back to being constipated.  She had a moderate amount of bright red blood on the toilet paper and in the toilet bowl on a couple of occasions one week ago.  Complains of frequent lower abdominal and back pain as well.  Says that she constantly feels full and bloated.  No family history of IBD to her knowledge.  She was told to start Miralax by the staff at [facility].    History reviewed. No pertinent past medical history. Past Surgical History:  Procedure Laterality Date  . CHELATION OF EYE Right   . CYST EXCISION     Left leg    reports that she has been smoking.  She has never used smokeless tobacco. She reports that she drinks alcohol. She reports that she does not use drugs. family history includes Brain cancer in her maternal grandfather; Cancer in her maternal grandmother; Hypertension in her maternal grandmother and paternal grandmother; Pancreatic cancer in her paternal grandmother. No Known Allergies    Outpatient Encounter Prescriptions as of 09/05/2016  Medication Sig  . polyethylene glycol powder (GLYCOLAX/MIRALAX) powder give 1/2 CAPFUL DISSOLVED IN 8 OUNCES OF LIQUID EVERY DAY  . [DISCONTINUED] acetaminophen (TYLENOL) 325 MG tablet Take 650 mg by mouth every 6 (six) hours as needed for fever.  . [DISCONTINUED] clindamycin (CLEOCIN) 300 MG capsule Take 1 capsule (300 mg total) by  mouth 3 (three) times daily. X 7 days  . [DISCONTINUED] ibuprofen (ADVIL,MOTRIN) 200 MG tablet Take 200 mg by mouth every 6 (six) hours as needed for fever.   No facility-administered encounter medications on file as of 09/05/2016.    REVIEW OF SYSTEMS  : All other systems reviewed and negative except where noted in the History of Present Illness.  PHYSICAL EXAM: BP 110/70 (BP Location: Left Arm, Patient Position: Sitting, Cuff Size: Normal)   Pulse 92   Ht 5' 3.25" (1.607 m) Comment: height measured without shoes  Wt 163 lb 8 oz (74.2 kg)   LMP 08/12/2016   BMI 28.73 kg/m  General: Well developed black female in no acute distress Head: Normocephalic and atraumatic Eyes:  Sclerae anicteric, conjunctiva pink. Ears: Normal auditory acuity Lungs: Clear throughout to auscultation Heart: Regular rate and rhythm Abdomen: Soft, non-distended.  Normal bowel sounds.  Non-tender. Rectal:  No external abnormalities noted.  DRE did not reveal any masses but was slightly tender.  No stool on exam glove.  Anoscopy performed and tolerated well.  She appeared to have some inflammation in her rectum. Musculoskeletal: Symmetrical with no gross deformities  Skin: No lesions on visible extremities Extremities: No edema  Neurological: Alert oriented x 4, grossly non-focal Psychological:  Alert and cooperative. Normal mood and affect  ASSESSMENT AND PLAN: *21 year old female with a change in bowel habits over the past few months, primarily with constipation, but then had two bouts of severe diarrhea  with incontinence.  Also reports abdominal pain and rectal bleeding.  On anoscopy she appeared to have some inflammation in her rectum.  Will check some labs including CBC, CMP, TSH, sed rate, and CRP.  Will schedule for colonoscopy as well to further evaluate and biopsy if needed, rule out IBD.  In the meantime she will use Miralax daily.  -Colonoscopy scheduled with Dr. Lavon PaganiniNandigam.  Dr. Russella DarStark with no available  procedures until May.  The risks, benefits, and alternatives to colonoscopy were discussed with the patient and she consents to proceed.   CC:  No ref. provider found

## 2016-09-07 ENCOUNTER — Encounter: Payer: Self-pay | Admitting: Gastroenterology

## 2016-09-07 DIAGNOSIS — R109 Unspecified abdominal pain: Secondary | ICD-10-CM | POA: Insufficient documentation

## 2016-09-07 DIAGNOSIS — K625 Hemorrhage of anus and rectum: Secondary | ICD-10-CM | POA: Insufficient documentation

## 2016-09-07 DIAGNOSIS — R194 Change in bowel habit: Secondary | ICD-10-CM | POA: Insufficient documentation

## 2016-09-12 NOTE — Progress Notes (Signed)
Reviewed and agree with documentation and assessment and plan. K. Veena Lilia Letterman , MD   

## 2016-09-13 ENCOUNTER — Encounter: Payer: Self-pay | Admitting: Gastroenterology

## 2016-09-13 ENCOUNTER — Ambulatory Visit (AMBULATORY_SURGERY_CENTER): Payer: BLUE CROSS/BLUE SHIELD | Admitting: Gastroenterology

## 2016-09-13 VITALS — BP 110/59 | HR 35 | Temp 98.6°F | Resp 21 | Ht 63.0 in | Wt 163.0 lb

## 2016-09-13 DIAGNOSIS — K625 Hemorrhage of anus and rectum: Secondary | ICD-10-CM

## 2016-09-13 MED ORDER — SODIUM CHLORIDE 0.9 % IV SOLN: 500.0000 mL | INTRAVENOUS | Status: AC

## 2016-09-13 NOTE — Progress Notes (Signed)
Pt's states no medical or surgical changes since previsit or office visit. 

## 2016-09-13 NOTE — Op Note (Signed)
[facility] Patient Name: Sheryl Brady Procedure Date: 09/13/2016 1:46 PM MRN: [id number] Endoscopist: Napoleon Form , MD Age: 21 Referring MD:  Date of Birth: [birth year] Gender: Female Account #: [id number] Procedure:                Colonoscopy Indications:              Evaluation of unexplained GI bleeding, blood per                            rectum Medicines:                Monitored Anesthesia Care Procedure:                Pre-Anesthesia Assessment:                           - Prior to the procedure, a History and Physical                            was performed, and patient medications and                            allergies were reviewed. The patient's tolerance of                            previous anesthesia was also reviewed. The risks                            and benefits of the procedure and the sedation                            options and risks were discussed with the patient.                            All questions were answered, and informed consent                            was obtained. Prior Anticoagulants: The patient has                            taken no previous anticoagulant or antiplatelet                            agents. ASA Grade Assessment: II - A patient with                            mild systemic disease. After reviewing the risks                            and benefits, the patient was deemed in                            satisfactory condition to undergo the procedure.  After obtaining informed consent, the colonoscope                            was passed under direct vision. Throughout the                            procedure, the patient's blood pressure, pulse, and                            oxygen saturations were monitored continuously. The                            Model CF-HQ190L [id number]) scope was introduced                            through the anus and advanced to the the terminal                             ileum, with identification of the appendiceal                            orifice and IC valve. The colonoscopy was performed                            without difficulty. The patient tolerated the                            procedure well. The quality of the bowel                            preparation was excellent. The terminal ileum,                            ileocecal valve, appendiceal orifice, and rectum                            were photographed. Scope In: 2:01:34 PM Scope Out: 2:14:57 PM Scope Withdrawal Time: 0 hours 7 minutes 57 seconds  Total Procedure Duration: 0 hours 13 minutes 23 seconds  Findings:                 The perianal and digital rectal examinations were                            normal.                           The terminal ileum appeared normal.                           The entire examined colon appeared normal. Complications:            No immediate complications. Estimated Blood Loss:     Estimated blood loss: none. Impression:               - The examined portion of the ileum was normal.                           -  The entire examined colon is normal.                           - No specimens collected. Recommendation:           - Patient has a contact number available for                            emergencies. The signs and symptoms of potential                            delayed complications were discussed with the                            patient. Return to normal activities tomorrow.                            Written discharge instructions were provided to the                            patient.                           - Resume previous diet.                           - Continue present medications.                           - Repeat colonoscopy at age 21 for screening                            purposes.                           - Return to GI clinic PRN. Napoleon FormKavitha V. Raelea Gosse, MD 09/13/2016 2:18:59 PM This  report has been signed electronically.

## 2016-09-13 NOTE — Patient Instructions (Signed)
YOU HAD AN ENDOSCOPIC PROCEDURE TODAY AT THE [facility]:   Refer to the procedure report that was given to you for any specific questions about what was found during the examination.  If the procedure report does not answer your questions, please call your gastroenterologist to clarify.  If you requested that your care partner not be given the details of your procedure findings, then the procedure report has been included in a sealed envelope for you to review at your convenience later.  YOU SHOULD EXPECT: Some feelings of bloating in the abdomen. Passage of more gas than usual.  Walking can help get rid of the air that was put into your GI tract during the procedure and reduce the bloating. If you had a lower endoscopy (such as a colonoscopy or flexible sigmoidoscopy) you may notice spotting of blood in your stool or on the toilet paper. If you underwent a bowel prep for your procedure, you may not have a normal bowel movement for a few days.  Please Note:  You might notice some irritation and congestion in your nose or some drainage.  This is from the oxygen used during your procedure.  There is no need for concern and it should clear up in a day or so.  SYMPTOMS TO REPORT IMMEDIATELY:   Following lower endoscopy (colonoscopy or flexible sigmoidoscopy):  Excessive amounts of blood in the stool  Significant tenderness or worsening of abdominal pains  Swelling of the abdomen that is new, acute  Fever of 100F or higher  For urgent or emergent issues, a gastroenterologist can be reached at any hour by calling [phone].   DIET:  We do recommend a small meal at first, but then you may proceed to your regular diet.  Drink plenty of fluids but you should avoid alcoholic beverages for 24 hours.  ACTIVITY:  You should plan to take it easy for the rest of today and you should NOT DRIVE or use heavy machinery until tomorrow (because of the sedation medicines used during the test).     FOLLOW UP: Our staff will call the number listed on your records the next business day following your procedure to check on you and address any questions or concerns that you may have regarding the information given to you following your procedure. If we do not reach you, we will leave a message.  However, if you are feeling well and you are not experiencing any problems, there is no need to return our call.  We will assume that you have returned to your regular daily activities without incident.  If any biopsies were taken you will be contacted by phone or by letter within the next 1-3 weeks.  Please call us at [phone] if you have not heard about the biopsies in 3 weeks.    SIGNATURES/CONFIDENTIALITY: You and/or your care partner have signed paperwork which will be entered into your electronic medical record.  These signatures attest to the fact that that the information above on your After Visit Summary has been reviewed and is understood.  Full responsibility of the confidentiality of this discharge information lies with you and/or your care-partner. 

## 2016-09-13 NOTE — Progress Notes (Signed)
Report to PACU, RN, vss, BBS= Clear.  

## 2016-09-14 ENCOUNTER — Telehealth: Payer: Self-pay | Admitting: *Deleted

## 2016-09-14 ENCOUNTER — Telehealth: Payer: Self-pay

## 2016-09-14 ENCOUNTER — Encounter: Payer: Self-pay | Admitting: *Deleted

## 2016-09-14 NOTE — Telephone Encounter (Signed)
  Follow up Call-  Call back number 09/13/2016  Post procedure Call Back phone  # [phone]  Permission to leave phone message Yes     Patient questions:  Do you have a fever, pain , or abdominal swelling? No. Pain Score  0 *  Have you tolerated food without any problems? Yes.    Have you been able to return to your normal activities? Yes.    Do you have any questions about your discharge instructions: Diet   No. Medications  No. Follow up visit  No.  Do you have questions or concerns about your Care? No.  Actions: * If pain score is 4 or above: No action needed, pain <4.  

## 2016-09-14 NOTE — Telephone Encounter (Signed)
  Follow up Call-  Call back number 09/13/2016  Post procedure Call Back phone  # [phone]  Permission to leave phone message Yes     Patient questions:  Do you have a fever, pain , or abdominal swelling? No. Pain Score  0 *  Have you tolerated food without any problems? Yes.    Have you been able to return to your normal activities? Yes.    Do you have any questions about your discharge instructions: Diet   No. Medications  No. Follow up visit  No.  Do you have questions or concerns about your Care? No.  Actions: * If pain score is 4 or above: No action needed, pain <4.

## 2016-09-14 NOTE — Telephone Encounter (Signed)
No answer. Left message that our staff will attempt to reach her again this afternoon.

## 2016-11-17 NOTE — Telephone Encounter (Signed)
Phone note created in error SM

## 2016-11-17 NOTE — Telephone Encounter (Signed)
Phone call created in error
